# Patient Record
Sex: Male | Born: 2005 | Race: White | Hispanic: No | Marital: Single | Smoking: Never smoker
Health system: Southern US, Community
[De-identification: ages and names within clinical notes are randomized; demographics above are authoritative.]

## PROBLEM LIST (undated history)

## (undated) DIAGNOSIS — J45909 Unspecified asthma, uncomplicated: Secondary | ICD-10-CM

## (undated) DIAGNOSIS — R011 Cardiac murmur, unspecified: Secondary | ICD-10-CM

---
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
## 2023-03-04 ENCOUNTER — Ambulatory Visit
Admission: EM | Admit: 2023-03-04 | Discharge: 2023-03-04 | Disposition: A | Discharge status: Home / Self Care | Payer: BC Managed Care – PPO

## 2023-03-04 ENCOUNTER — Encounter: Payer: Self-pay | Admitting: Emergency Medicine

## 2023-03-04 DIAGNOSIS — J069 Acute upper respiratory infection, unspecified: Secondary | ICD-10-CM

## 2023-03-04 LAB — POCT RAPID STREP A (OFFICE): Rapid Strep A Screen: NEGATIVE

## 2023-03-04 MED ORDER — CYCLOBENZAPRINE HCL 10 MG PO TABS
10.0000 mg | ORAL_TABLET | Freq: Two times a day (BID) | ORAL | 0 refills | Status: DC | PRN
Start: 2023-03-04 — End: 2023-08-26

## 2023-03-04 MED ORDER — PREDNISONE 20 MG PO TABS: 40.0000 mg | ORAL_TABLET | Freq: Every day | ORAL | 0 refills | Status: DC

## 2023-03-04 NOTE — ED Provider Notes (Signed)
 [facility]    CSN: [id number] Arrival date & time: 03/04/23  1102      History   Chief Complaint Chief Complaint  Patient presents with   Sore Throat   Chills    HPI Ernest Lopez is a 18 y.o. male.   Patient presents for evaluation of subjective fever, chills, intermittent generalized headaches, bilateral ear popping and pain, productive cough with green sputum and sore throat present for 3 days, experiencing watery diarrhea today.  Tolerating food and liquid.  Known sick contact prior.  Has attempted use of ibuprofen  and Advil  cold and flu.  History of asthma, denies wheezing and shortness of breath.  Past Medical History:  Diagnosis Date   Asthma    Heart murmur     Patient Active Problem List   Diagnosis Date Noted   Acute appendicitis 05/25/2022   Distal radius fracture 09/27/2017    Past Surgical History:  Procedure Laterality Date   CLOSED REDUCTION WRIST FRACTURE Right 09/28/2017   Procedure: CLOSED REDUCTION WRIST;  Surgeon: Tobie Priest, MD;  Location: [facility] ORS;  Service: Orthopedics;  Laterality: Right;   XI ROBOTIC LAPAROSCOPIC ASSISTED APPENDECTOMY N/A 05/25/2022   Procedure: XI ROBOTIC LAPAROSCOPIC ASSISTED APPENDECTOMY;  Surgeon: Tye Millet, DO;  Location: [facility] ORS;  Service: General;  Laterality: N/A;       Home Medications    Prior to Admission medications   Medication Sig Start Date End Date Taking? Authorizing Provider  albuterol  (PROVENTIL ) (2.5 MG/3ML) 0.083% nebulizer solution  02/14/22  Yes [provider]  cyclobenzaprine  (FLEXERIL ) 10 MG tablet Take 1 tablet (10 mg total) by mouth 2 (two) times daily as needed for muscle spasms. 03/04/23  Yes Suhana Wilner R, NP  predniSONE  (DELTASONE ) 20 MG tablet Take 2 tablets (40 mg total) by mouth daily. 03/04/23  Yes Tanga Gloor R, NP  ibuprofen  (ADVIL ) 800 MG tablet Take 1 tablet (800 mg total) by mouth every 8 (eight) hours as needed for mild pain or moderate pain. 05/26/22    Sakai, Isami, DO    Family History History reviewed. No pertinent family history.  Social History Social History   Tobacco Use   Smoking status: Never   Smokeless tobacco: Never  Vaping Use   Vaping status: Never Used  Substance Use Topics   Alcohol use: No   Drug use: No     Allergies   Patient has no known allergies.   Review of Systems Review of Systems   Physical Exam Triage Vital Signs ED Triage Vitals  Encounter Vitals Group     BP 03/04/23 1214 119/60     Systolic BP Percentile --      Diastolic BP Percentile --      Pulse Rate 03/04/23 1214 69     Resp 03/04/23 1214 18     Temp 03/04/23 1214 98.5 F (36.9 C)     Temp Source 03/04/23 1214 Oral     SpO2 03/04/23 1214 97 %     Weight 03/04/23 1211 145 lb (65.8 kg)     Height 03/04/23 1211 6' 1 (1.854 m)     Head Circumference --      Peak Flow --      Pain Score 03/04/23 1210 6     Pain Loc --      Pain Education --      Exclude from Growth Chart --    No data found.  Updated Vital Signs BP 119/60 (BP Location: Left Arm)  Pulse 69   Temp 98.5 F (36.9 C) (Oral)   Resp 18   Ht 6' 1 (1.854 m)   Wt 145 lb (65.8 kg)   SpO2 97%   BMI 19.13 kg/m   Visual Acuity Right Eye Distance:   Left Eye Distance:   Bilateral Distance:    Right Eye Near:   Left Eye Near:    Bilateral Near:     Physical Exam Constitutional:      Appearance: Normal appearance.  HENT:     Head: Normocephalic.     Right Ear: Tympanic membrane, ear canal and external ear normal.     Left Ear: Tympanic membrane, ear canal and external ear normal.     Nose: Congestion present. No rhinorrhea.     Mouth/Throat:     Mouth: Mucous membranes are moist.     Pharynx: Posterior oropharyngeal erythema present. No oropharyngeal exudate.  Eyes:     Extraocular Movements: Extraocular movements intact.  Neck:     Comments: Tenderness present to bilateral aspects of the neck without ecchymosis swelling or deformity, able to  complete full range of motion, 2+ carotid pulses Cardiovascular:     Rate and Rhythm: Normal rate and regular rhythm.     Pulses: Normal pulses.     Heart sounds: Normal heart sounds.  Pulmonary:     Effort: Pulmonary effort is normal.     Breath sounds: Normal breath sounds.  Musculoskeletal:     Cervical back: Normal range of motion and neck supple.  Neurological:     Mental Status: He is alert and oriented to person, place, and time. Mental status is at baseline.      UC Treatments / Results  Labs (all labs ordered are listed, but only abnormal results are displayed) Labs Reviewed  POCT RAPID STREP A (OFFICE)    EKG   Radiology No results found.  Procedures Procedures (including critical care time)  Medications Ordered in UC Medications - No data to display  Initial Impression / Assessment and Plan / UC Course  I have reviewed the triage vital signs and the nursing notes.  Pertinent labs & imaging results that were available during my care of the patient were reviewed by me and considered in my medical decision making (see chart for details).  Viral URI with cough  Patient is in no signs of distress nor toxic appearing.  Vital signs are stable.  Low suspicion for pneumonia, pneumothorax or bronchitis and therefore will defer imaging.  Strep test negative.  Prescribed prednisone  and Flexeril  for management of neck pain which is most likely a manifestation of bodyaches.  Amended additional supportive measures for muscular pain.May use additional over-the-counter medications as needed for supportive care.  May follow-up with urgent care as needed if symptoms persist or worsen.   Final Clinical Impressions(s) / UC Diagnoses   Final diagnoses:  Viral URI with cough     Discharge Instructions      Your symptoms today are most likely being caused by a virus and should steadily improve in time it can take up to 7 to 10 days before you truly start to see a turnaround  however things will get better  strep test is negative for bacteria  For your neck pain which is most likely a manifestation of bodyaches you may begin prednisone  every morning with food for 5 days, stop use of ibuprofen , may take Tylenol  additionally  May use muscle relaxant twice daily for additional comfort, be mindful this  will make you feel drowsy  May use ice or heat over the affected area, massage and stretch as tolerated    You can take Tylenol  and/or Ibuprofen  as needed for fever reduction and pain relief.   For cough: honey 1/2 to 1 teaspoon (you can dilute the honey in water or another fluid).  You can also use guaifenesin and dextromethorphan for cough. You can use a humidifier for chest congestion and cough.  If you don't have a humidifier, you can sit in the bathroom with the hot shower running.      For sore throat: try warm salt water gargles, cepacol lozenges, throat spray, warm tea or water with lemon/honey, popsicles or ice, or OTC cold relief medicine for throat discomfort.   For congestion: take a daily anti-histamine like Zyrtec , Claritin, and a oral decongestant, such as pseudoephedrine.  You can also use Flonase 1-2 sprays in each nostril daily.   It is important to stay hydrated: drink plenty of fluids (water, gatorade/powerade/pedialyte, juices, or teas) to keep your throat moisturized and help further relieve irritation/discomfort.    ED Prescriptions     Medication Sig Dispense Auth. Provider   predniSONE  (DELTASONE ) 20 MG tablet Take 2 tablets (40 mg total) by mouth daily. 10 tablet Lanny Lipkin R, NP   cyclobenzaprine  (FLEXERIL ) 10 MG tablet Take 1 tablet (10 mg total) by mouth 2 (two) times daily as needed for muscle spasms. 20 tablet Brigham Cobbins R, NP      PDMP not reviewed this encounter.   Teresa Shelba SAUNDERS, NP 03/04/23 1243

## 2023-03-04 NOTE — Discharge Instructions (Signed)
 Your symptoms today are most likely being caused by a virus and should steadily improve in time it can take up to 7 to 10 days before you truly start to see a turnaround however things will get better  strep test is negative for bacteria  For your neck pain which is most likely a manifestation of bodyaches you may begin prednisone  every morning with food for 5 days, stop use of ibuprofen , may take Tylenol  additionally  May use muscle relaxant twice daily for additional comfort, be mindful this will make you feel drowsy  May use ice or heat over the affected area, massage and stretch as tolerated    You can take Tylenol  and/or Ibuprofen  as needed for fever reduction and pain relief.   For cough: honey 1/2 to 1 teaspoon (you can dilute the honey in water or another fluid).  You can also use guaifenesin and dextromethorphan for cough. You can use a humidifier for chest congestion and cough.  If you don't have a humidifier, you can sit in the bathroom with the hot shower running.      For sore throat: try warm salt water gargles, cepacol lozenges, throat spray, warm tea or water with lemon/honey, popsicles or ice, or OTC cold relief medicine for throat discomfort.   For congestion: take a daily anti-histamine like Zyrtec , Claritin, and a oral decongestant, such as pseudoephedrine.  You can also use Flonase 1-2 sprays in each nostril daily.   It is important to stay hydrated: drink plenty of fluids (water, gatorade/powerade/pedialyte, juices, or teas) to keep your throat moisturized and help further relieve irritation/discomfort.

## 2023-03-04 NOTE — ED Triage Notes (Signed)
 Patient in office today complaint of sorethroat, neck, head pain with chills 3ds  with diarrhea x today   OTC: ibuprofen  Denies: N/V unsure of fever

## 2023-03-26 ENCOUNTER — Emergency Department
Admission: EM | Admit: 2023-03-26 | Discharge: 2023-03-26 | Discharge status: Against Medical Advice | Payer: BC Managed Care – PPO | Attending: Emergency Medicine | Admitting: Emergency Medicine

## 2023-03-26 ENCOUNTER — Other Ambulatory Visit: Payer: Self-pay

## 2023-03-26 DIAGNOSIS — Z5321 Procedure and treatment not carried out due to patient leaving prior to being seen by health care provider: Secondary | ICD-10-CM | POA: Diagnosis not present

## 2023-03-26 DIAGNOSIS — R197 Diarrhea, unspecified: Secondary | ICD-10-CM | POA: Diagnosis not present

## 2023-03-26 DIAGNOSIS — R1110 Vomiting, unspecified: Secondary | ICD-10-CM | POA: Insufficient documentation

## 2023-03-26 LAB — URINALYSIS, ROUTINE W REFLEX MICROSCOPIC
Bilirubin Urine: NEGATIVE
Glucose, UA: NEGATIVE mg/dL
Hgb urine dipstick: NEGATIVE
Ketones, ur: NEGATIVE mg/dL
Leukocytes,Ua: NEGATIVE
Nitrite: NEGATIVE
Protein, ur: NEGATIVE mg/dL
Specific Gravity, Urine: 1.025 (ref 1.005–1.030)
pH: 5 (ref 5.0–8.0)

## 2023-03-26 LAB — CBC
HCT: 49.9 % (ref 39.0–52.0)
Hemoglobin: 16.7 g/dL (ref 13.0–17.0)
MCH: 30.2 pg (ref 26.0–34.0)
MCHC: 33.5 g/dL (ref 30.0–36.0)
MCV: 90.2 fL (ref 80.0–100.0)
Platelets: 253 10*3/uL (ref 150–400)
RBC: 5.53 MIL/uL (ref 4.22–5.81)
RDW: 11.9 % (ref 11.5–15.5)
WBC: 17.1 10*3/uL — ABNORMAL HIGH (ref 4.0–10.5)
nRBC: 0 % (ref 0.0–0.2)

## 2023-03-26 LAB — COMPREHENSIVE METABOLIC PANEL
ALT: 19 U/L (ref 0–44)
AST: 25 U/L (ref 15–41)
Albumin: 4.5 g/dL (ref 3.5–5.0)
Alkaline Phosphatase: 82 U/L (ref 38–126)
Anion gap: 11 (ref 5–15)
BUN: 15 mg/dL (ref 6–20)
CO2: 23 mmol/L (ref 22–32)
Calcium: 9.9 mg/dL (ref 8.9–10.3)
Chloride: 104 mmol/L (ref 98–111)
Creatinine, Ser: 1.07 mg/dL (ref 0.61–1.24)
GFR, Estimated: >60 mL/min (ref >60)
Glucose, Bld: 134 mg/dL — ABNORMAL HIGH (ref 70–99)
Potassium: 4.5 mmol/L (ref 3.5–5.1)
Sodium: 138 mmol/L (ref 135–145)
Total Bilirubin: 1.2 mg/dL (ref 0.0–1.2)
Total Protein: 8.3 g/dL — ABNORMAL HIGH (ref 6.5–8.1)

## 2023-03-26 LAB — LIPASE, BLOOD: Lipase: 28 U/L (ref 11–51)

## 2023-03-26 LAB — TROPONIN I (HIGH SENSITIVITY): Troponin I (High Sensitivity): 3 ng/L (ref <18)

## 2023-03-26 MED ORDER — ONDANSETRON 4 MG PO TBDP: 4.0000 mg | ORAL_TABLET | Freq: Once | ORAL | Status: DC | PRN

## 2023-03-26 MED ORDER — ONDANSETRON HCL 4 MG/2ML IJ SOLN
INTRAMUSCULAR | Status: AC
  Filled 2023-03-26: qty 2

## 2023-03-26 MED ORDER — ONDANSETRON HCL 4 MG/2ML IJ SOLN
4.0000 mg | Freq: Once | INTRAMUSCULAR | Status: AC | PRN
  Administered 2023-03-26: 4 mg via INTRAVENOUS

## 2023-03-26 NOTE — ED Notes (Signed)
Pt up to nurses desk, informed leaving, states too many sick people in ER. NAD noted

## 2023-03-26 NOTE — ED Triage Notes (Signed)
Patient reports diarrhea and vomiting that started today. Patient states that his while family is sick but he symptoms are the worse. Patient states he ate at a family members house yesterday. Also reports chest pain 4/10.

## 2023-05-02 ENCOUNTER — Ambulatory Visit: Payer: BC Managed Care – PPO | Admitting: Dermatology

## 2023-08-26 ENCOUNTER — Ambulatory Visit
Admission: EM | Admit: 2023-08-26 | Discharge: 2023-08-26 | Disposition: A | Discharge status: Home / Self Care | Attending: Physician Assistant | Admitting: Physician Assistant

## 2023-08-26 DIAGNOSIS — L282 Other prurigo: Secondary | ICD-10-CM | POA: Diagnosis not present

## 2023-08-26 DIAGNOSIS — L237 Allergic contact dermatitis due to plants, except food: Secondary | ICD-10-CM

## 2023-08-26 MED ORDER — METHYLPREDNISOLONE ACETATE 80 MG/ML IJ SUSP: 60.0000 mg | Freq: Once | INTRAMUSCULAR | Status: DC

## 2023-08-26 MED ORDER — DEXAMETHASONE SODIUM PHOSPHATE 10 MG/ML IJ SOLN
10.0000 mg | Freq: Once | INTRAMUSCULAR | Status: AC
  Administered 2023-08-26: 10 mg via INTRAMUSCULAR

## 2023-08-26 MED ORDER — PREDNISONE 10 MG (21) PO TBPK: ORAL_TABLET | ORAL | 0 refills | Status: AC

## 2023-08-26 NOTE — ED Provider Notes (Signed)
 CAY RALPH PELT    CSN: [id number] Arrival date & time: 08/26/23  1345      History   Chief Complaint Chief Complaint  Patient presents with   Rash    HPI Ernest Lopez is a 18 y.o. male.   Patient presents with a 2-week history of pruritic rash involving his extremities.  He reports that he often gets poison ivy at work and this was a similar presentation with small vesicles and intense pruritus.  He had prednisone  from a leftover prescription and so has been taking 20 mg a day for the past 3 to 4 days but this has not resolved his symptoms.  He is anxious to feel better as he is scheduled to go out of town.  He denies additional antibiotics or steroids in the past 90 days.  Denies history of diabetes.  He has been using over-the-counter poison ivy scrubs and topical medications without improvement of symptoms.  Denies any swelling of his throat, shortness of breath, fever, nausea, vomiting.    Past Medical History:  Diagnosis Date   Asthma    Heart murmur     Patient Active Problem List   Diagnosis Date Noted   Acute appendicitis 05/25/2022   Distal radius fracture 09/27/2017    Past Surgical History:  Procedure Laterality Date   CLOSED REDUCTION WRIST FRACTURE Right 09/28/2017   Procedure: CLOSED REDUCTION WRIST;  Surgeon: Tobie Priest, MD;  Location: [facility] ORS;  Service: Orthopedics;  Laterality: Right;   XI ROBOTIC LAPAROSCOPIC ASSISTED APPENDECTOMY N/A 05/25/2022   Procedure: XI ROBOTIC LAPAROSCOPIC ASSISTED APPENDECTOMY;  Surgeon: Tye Millet, DO;  Location: [facility] OR[facility];  Service: General;  Laterality: N/A;       Home Medications    Prior to Admission medications   Medication Sig Start Date End Date Taking? Authorizing Provider  predniSONE  (STERAPRED UNI-PAK 21 TAB) 10 MG (21) TBPK tablet As directed 08/26/23  Yes Amaka Gluth K, PA-C  albuterol  (PROVENTIL ) (2.5 MG/3ML) 0.083% nebulizer solution  02/14/22   [provider]    Family  History History reviewed. No pertinent family history.  Social History Social History   Tobacco Use   Smoking status: Never   Smokeless tobacco: Never  Vaping Use   Vaping status: Every Day  Substance Use Topics   Alcohol use: No   Drug use: No     Allergies   Patient has no known allergies.   Review of Systems Review of Systems  Constitutional:  Positive for activity change. Negative for appetite change, fatigue and fever.  HENT:  Negative for sore throat, trouble swallowing and voice change.   Respiratory:  Negative for shortness of breath.   Gastrointestinal:  Negative for diarrhea, nausea and vomiting.  Skin:  Positive for rash. Negative for wound.     Physical Exam Triage Vital Signs ED Triage Vitals  Encounter Vitals Group     BP 08/26/23 1356 (!) 122/56     Girls Systolic BP Percentile --      Girls Diastolic BP Percentile --      Boys Systolic BP Percentile --      Boys Diastolic BP Percentile --      Pulse Rate 08/26/23 1356 65     Resp 08/26/23 1356 18     Temp 08/26/23 1356 98.1 F (36.7 C)     Temp Source 08/26/23 1356 Oral     SpO2 08/26/23 1356 97 %     Weight --      Height --  Head Circumference --      Peak Flow --      Pain Score 08/26/23 1357 0     Pain Loc --      Pain Education --      Exclude from Growth Chart --    No data found.  Updated Vital Signs BP (!) 122/56 (BP Location: Right Arm)   Pulse 65   Temp 98.1 F (36.7 C) (Oral)   Resp 18   SpO2 97%   Visual Acuity Right Eye Distance:   Left Eye Distance:   Bilateral Distance:    Right Eye Near:   Left Eye Near:    Bilateral Near:     Physical Exam Vitals reviewed.  Constitutional:      General: He is awake.     Appearance: Normal appearance. He is well-developed. He is not ill-appearing.     Comments: Very pleasant male appears stated age in no acute distress sitting comfortably in exam room  HENT:     Head: Normocephalic and atraumatic.     Mouth/Throat:      Pharynx: Uvula midline. No oropharyngeal exudate or posterior oropharyngeal erythema.   Cardiovascular:     Rate and Rhythm: Normal rate and regular rhythm.     Heart sounds: Normal heart sounds, S1 normal and S2 normal. No murmur heard. Pulmonary:     Effort: Pulmonary effort is normal.     Breath sounds: Normal breath sounds. No stridor. No wheezing, rhonchi or rales.     Comments: Clear to auscultation bilaterally  Skin:    Findings: Rash present. Rash is macular and papular.     Comments: Scattered macular papular rash on posterior legs and upper arms with a few small vesicles noted upper lateral left arm.  No active bleeding or drainage noted.  No wounds on exam.   Neurological:     Mental Status: He is alert.   Psychiatric:        Behavior: Behavior is cooperative.      UC Treatments / Results  Labs (all labs ordered are listed, but only abnormal results are displayed) Labs Reviewed - No data to display  EKG   Radiology No results found.  Procedures Procedures (including critical care time)  Medications Ordered in UC Medications  dexamethasone  (DECADRON ) injection 10 mg (10 mg Intramuscular Given 08/26/23 1441)    Initial Impression / Assessment and Plan / UC Course  I have reviewed the triage vital signs and the nursing notes.  Pertinent labs & imaging results that were available during my care of the patient were reviewed by me and considered in my medical decision making (see chart for details).     Patient is well-appearing, afebrile, nontoxic, nontachycardic.  Concern for rhus dermatitis given clinical presentation.  He was given 10 mg Decadron  in clinic to help manage symptoms.  He was given prednisone  taper to begin tomorrow (08/27/2023) with instructions to take NSAIDs with this medication to risk of GI bleeding.  He is to keep the area clean and avoid scratching to prevent secondary infection.  We discussed that if he has any worsening or changing  symptoms he needs to be seen immediately.  Strict return precautions given.  Final Clinical Impressions(s) / UC Diagnoses   Final diagnoses:  Allergic contact dermatitis due to plants, except food  Pruritic rash     Discharge Instructions      We gave an injection of steroids today.  Placed her prednisone  tomorrow (08/27/2023).  Take as  prescribed and do not take any NSAIDs with this medication including aspirin, ibuprofen /Advil , naproxen/Aleve.  Keep ears clean and avoid scratching as this can lead to secondary infection.  If your rash is not improving or if anything worsens you need to be seen immediately.     ED Prescriptions     Medication Sig Dispense Auth. Provider   predniSONE  (STERAPRED UNI-PAK 21 TAB) 10 MG (21) TBPK tablet As directed 21 tablet Athira Janowicz K, PA-C      PDMP not reviewed this encounter.   Sherrell Rocky POUR, PA-C 08/26/23 1504

## 2023-08-26 NOTE — ED Triage Notes (Signed)
 Pt being seen in UC for rash x2 weeks. Pt reports having poison oak and being on prednisone  tablets with lingering rash throughout body.

## 2023-08-26 NOTE — Discharge Instructions (Signed)
 We gave an injection of steroids today.  Placed her prednisone  tomorrow (08/27/2023).  Take as prescribed and do not take any NSAIDs with this medication including aspirin, ibuprofen /Advil , naproxen/Aleve.  Keep ears clean and avoid scratching as this can lead to secondary infection.  If your rash is not improving or if anything worsens you need to be seen immediately.

## 2023-09-06 ENCOUNTER — Ambulatory Visit: Payer: BC Managed Care – PPO | Admitting: Dermatology

## 2023-10-02 ENCOUNTER — Ambulatory Visit: Admitting: Dermatology

## 2023-10-10 ENCOUNTER — Ambulatory Visit: Admitting: Dermatology

## 2023-10-10 ENCOUNTER — Encounter: Payer: Self-pay | Admitting: Dermatology

## 2023-10-10 DIAGNOSIS — Q825 Congenital non-neoplastic nevus: Secondary | ICD-10-CM | POA: Diagnosis not present

## 2023-10-10 DIAGNOSIS — L237 Allergic contact dermatitis due to plants, except food: Secondary | ICD-10-CM | POA: Diagnosis not present

## 2023-10-10 DIAGNOSIS — L578 Other skin changes due to chronic exposure to nonionizing radiation: Secondary | ICD-10-CM

## 2023-10-10 DIAGNOSIS — D229 Melanocytic nevi, unspecified: Secondary | ICD-10-CM

## 2023-10-10 DIAGNOSIS — Z1283 Encounter for screening for malignant neoplasm of skin: Secondary | ICD-10-CM

## 2023-10-10 DIAGNOSIS — L814 Other melanin hyperpigmentation: Secondary | ICD-10-CM

## 2023-10-10 MED ORDER — CLOBETASOL PROPIONATE 0.05 % EX CREA: TOPICAL_CREAM | CUTANEOUS | 2 refills | Status: AC

## 2023-10-10 NOTE — Progress Notes (Signed)
   Follow-Up Visit   Subjective  Ernest Lopez is a 18 y.o. male who presents for the following: Skin Cancer Screening and Full Body Skin Exam. No personal Hx of skin cancer or dysplastic nevi.  Family Hx of skin cancer, unknown type.  The patient presents for Total-Body Skin Exam (TBSE) for skin cancer screening and mole check. The patient has spots, moles and lesions to be evaluated, some may be new or changing and the patient may have concern these could be cancer.  The following portions of the chart were reviewed this encounter and updated as appropriate: medications, allergies, medical history  Review of Systems:  No other skin or systemic complaints except as noted in HPI or Assessment and Plan.  Objective  Well appearing patient in no apparent distress; mood and affect are within normal limits.  A full examination was performed including scalp, head, eyes, ears, nose, lips, neck, chest, axillae, abdomen, back, buttocks, bilateral upper extremities, bilateral lower extremities, hands, feet, fingers, toes, fingernails, and toenails. All findings within normal limits unless otherwise noted below.   Relevant physical exam findings are noted in the Assessment and Plan.  Mid Back Hypopigmented irregular patch  Assessment & Plan   SKIN CANCER SCREENING PERFORMED TODAY.  LENTIGINES,  - Benign normal skin lesions - Benign-appearing - Call for any changes  MELANOCYTIC NEVI - Tan-brown and/or pink-flesh-colored symmetric macules and papules - 0.2 cm regular brown macule. Stable compared to photo. - Benign appearing on exam today - Observation - Call clinic for new or changing moles - Recommend daily use of broad spectrum spf 30+ sunscreen to sun-exposed areas.   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  Observation.  Call clinic for new or changing lesions.  Recommend daily use of broad spectrum spf 30+ sunscreen to sun-exposed areas.    ALLERGIC CONTACT DERMATITIS Poison ivy Exam: scaly pink papules and/or plaques +/- vesiculation at arms and legs Treatment Plan: Start Clobetasol  0.05% cream apply twice a day to affected areas with rash/itching. Avoid applying to face, groin, and axilla. Use as directed. Long-term use can cause thinning of the skin.  Topical steroids (such as triamcinolone, fluocinolone, fluocinonide, mometasone, clobetasol , halobetasol, betamethasone, hydrocortisone) can cause thinning and lightening of the skin if they are used for too long in the same area. Your physician has selected the right strength medicine for your problem and area affected on the body. Please use your medication only as directed by your physician to prevent side effects.    Family History of Dysplastic Nevi   NEVUS ANEMICUS Mid Back Benign-appearing.  Observation.  Call clinic for new or changing lesions.  Recommend daily use of broad spectrum spf 30+ sunscreen to sun-exposed areas.   ACTINIC SKIN DAMAGE   LENTIGO   SKIN CANCER SCREENING   ALLERGIC CONTACT DERMATITIS DUE TO PLANTS, EXCEPT FOOD   MELANOCYTIC NEVUS, UNSPECIFIED LOCATION   Return for TBSE in 1-2 years.  I, Jill Parcell, CMA, am acting as scribe for Alm Rhyme, MD.   Documentation: I have reviewed the above documentation for accuracy and completeness, and I agree with the above.  Alm Rhyme, MD

## 2023-10-10 NOTE — Patient Instructions (Addendum)
 Start Clobetasol  0.05% cream apply twice a day to affected areas with rash/itching. Avoid applying to face, groin, and axilla. Use as directed. Long-term use can cause thinning of the skin.  Topical steroids (such as triamcinolone, fluocinolone, fluocinonide, mometasone, clobetasol , halobetasol, betamethasone, hydrocortisone) can cause thinning and lightening of the skin if they are used for too long in the same area. Your physician has selected the right strength medicine for your problem and area affected on the body. Please use your medication only as directed by your physician to prevent side effects.     Recommend daily broad spectrum sunscreen SPF 30+ to sun-exposed areas, reapply every 2 hours as needed. Call for new or changing lesions.  Staying in the shade or wearing long sleeves, sun glasses (UVA+UVB protection) and wide brim hats (4-inch brim around the entire circumference of the hat) are also recommended for sun protection.     Melanoma ABCDEs  Melanoma is the most dangerous type of skin cancer, and is the leading cause of death from skin disease.  You are more likely to develop melanoma if you: Have light-colored skin, light-colored eyes, or red or blond hair Spend a lot of time in the sun Tan regularly, either outdoors or in a tanning bed Have had blistering sunburns, especially during childhood Have a close family member who has had a melanoma Have atypical moles or large birthmarks  Early detection of melanoma is key since treatment is typically straightforward and cure rates are extremely high if we catch it early.   The first sign of melanoma is often a change in a mole or a new dark spot.  The ABCDE system is a way of remembering the signs of melanoma.  A for asymmetry:  The two halves do not match. B for border:  The edges of the growth are irregular. C for color:  A mixture of colors are present instead of an even brown color. D for diameter:  Melanomas are usually  (but not always) greater than 6mm - the size of a pencil eraser. E for evolution:  The spot keeps changing in size, shape, and color.  Please check your skin once per month between visits. You can use a small mirror in front and a large mirror behind you to keep an eye on the back side or your body.   If you see any new or changing lesions before your next follow-up, please call to schedule a visit.  Please continue daily skin protection including broad spectrum sunscreen SPF 30+ to sun-exposed areas, reapplying every 2 hours as needed when you're outdoors.   Staying in the shade or wearing long sleeves, sun glasses (UVA+UVB protection) and wide brim hats (4-inch brim around the entire circumference of the hat) are also recommended for sun protection.     Due to recent changes in healthcare laws, you may see results of your pathology and/or laboratory studies on MyChart before the doctors have had a chance to review them. We understand that in some cases there may be results that are confusing or concerning to you. Please understand that not all results are received at the same time and often the doctors may need to interpret multiple results in order to provide you with the best plan of care or course of treatment. Therefore, we ask that you please give us  2 business days to thoroughly review all your results before contacting the office for clarification. Should we see a critical lab result, you will be contacted sooner.  If You Need Anything After Your Visit  If you have any questions or concerns for your doctor, please call our main line at [phone] and press option 4 to reach your doctor's medical assistant. If no one answers, please leave a voicemail as directed and we will return your call as soon as possible. Messages left after 4 pm will be answered the following business day.   You may also send us  a message via MyChart. We typically respond to MyChart messages within 1-2 business  days.  For prescription refills, please ask your pharmacy to contact our office. Our fax number is [phone].  If you have an urgent issue when the clinic is closed that cannot wait until the next business day, you can page your doctor at the number below.    Please note that while we do our best to be available for urgent issues outside of office hours, we are not available 24/7.   If you have an urgent issue and are unable to reach us , you may choose to seek medical care at your doctor's office, retail clinic, urgent care center, or emergency room.  If you have a medical emergency, please immediately call 911 or go to the emergency department.  Pager Numbers  - Dr. Hester: [phone]  - Dr. Jackquline: [phone]  - Dr. Claudene: [phone]   In the event of inclement weather, please call our main line at [phone] for an update on the status of any delays or closures.  Dermatology Medication Tips: Please keep the boxes that topical medications come in in order to help keep track of the instructions about where and how to use these. Pharmacies typically print the medication instructions only on the boxes and not directly on the medication tubes.   If your medication is too expensive, please contact our office at [phone] option 4 or send us  a message through MyChart.   We are unable to tell what your co-pay for medications will be in advance as this is different depending on your insurance coverage. However, we may be able to find a substitute medication at lower cost or fill out paperwork to get insurance to cover a needed medication.   If a prior authorization is required to get your medication covered by your insurance company, please allow us  1-2 business days to complete this process.  Drug prices often vary depending on where the prescription is filled and some pharmacies may offer cheaper prices.  The website www.goodrx.com contains coupons for medications  through different pharmacies. The prices here do not account for what the cost may be with help from insurance (it may be cheaper with your insurance), but the website can give you the price if you did not use any insurance.  - You can print the associated coupon and take it with your prescription to the pharmacy.  - You may also stop by our office during regular business hours and pick up a GoodRx coupon card.  - If you need your prescription sent electronically to a different pharmacy, notify our office through [facility] or by phone at [phone] option 4.     Si Usted Necesita Algo Despus de Su Visita  Tambin puede enviarnos un mensaje a travs de Clinical cytogeneticist. Por lo general respondemos a los mensajes de MyChart en el transcurso de 1 a 2 das hbiles.  Para renovar recetas, por favor pida a su farmacia que se ponga en contacto con nuestra oficina. Randi lakes de fax es [place] [phone].  Si tiene un asunto urgente cuando la clnica est cerrada y que no puede esperar hasta el siguiente da hbil, puede llamar/localizar a su doctor(a) al nmero que aparece a continuacin.   Por favor, tenga en cuenta que aunque hacemos todo lo posible para estar disponibles para asuntos urgentes fuera del horario de [place], no estamos disponibles las 24 horas del da, los 7 [address]  [address] de la [place].   Si tiene un problema urgente y no puede comunicarse con nosotros, puede optar por buscar atencin mdica  en el consultorio de su doctor(a), en una clnica privada, en un centro de atencin urgente o en una sala de emergencias.  Si tiene Engineer, drilling, por favor llame inmediatamente al 911 o vaya a la sala de emergencias.  Nmeros de bper  - Dr. Hester: [phone]  - Dra. Jackquline: [phone]  - Dr. Claudene: [phone]   En caso de inclemencias del tiempo, por favor llame a landry capes principal al [phone] para una actualizacin sobre el [place] de cualquier retraso o  cierre.  Consejos para la medicacin en dermatologa: Por favor, guarde las cajas en las que vienen los medicamentos de uso tpico para ayudarle a seguir las instrucciones sobre dnde y cmo usarlos. Las farmacias generalmente imprimen las instrucciones del medicamento slo en las cajas y no directamente en los tubos del [place].   Si su medicamento es muy caro, por favor, pngase en contacto con landry rieger llamando al [phone] y presione la opcin 4 o envenos un mensaje a travs de Clinical cytogeneticist.   No podemos decirle cul ser su copago por los medicamentos por adelantado ya que esto es diferente dependiendo de la cobertura de su seguro. Sin embargo, es posible que podamos encontrar un medicamento sustituto a Audiological scientist un formulario para que el seguro cubra el medicamento que se considera necesario.   Si se requiere una autorizacin previa para que su compaa de seguros malta su medicamento, por favor permtanos de 1 a 2 das hbiles para completar este proceso.  Los precios de los medicamentos varan con frecuencia dependiendo del Environmental consultant de dnde se surte la receta y alguna farmacias pueden ofrecer precios ms baratos.  El sitio web www.goodrx.com tiene cupones para medicamentos de Health and safety inspector. Los precios aqu no tienen en cuenta lo que podra costar con la ayuda del seguro (puede ser ms barato con su seguro), pero el sitio web puede darle el precio si no utiliz Tourist information centre manager.  - Puede imprimir el cupn correspondiente y llevarlo con su receta a la farmacia.  - Tambin puede pasar por nuestra oficina durante el horario de atencin regular y Education officer, museum una tarjeta de cupones de GoodRx.  - Si necesita que su receta se enve electrnicamente a una farmacia diferente, informe a nuestra oficina a travs de MyChart de [place] o por telfono llamando al [phone] y presione la opcin 4.

## 2023-10-12 ENCOUNTER — Ambulatory Visit

## 2023-10-15 ENCOUNTER — Ambulatory Visit
Admission: EM | Admit: 2023-10-15 | Discharge: 2023-10-15 | Disposition: A | Discharge status: Home / Self Care | Attending: Emergency Medicine | Admitting: Emergency Medicine

## 2023-10-15 ENCOUNTER — Other Ambulatory Visit: Payer: Self-pay

## 2023-10-15 DIAGNOSIS — L237 Allergic contact dermatitis due to plants, except food: Secondary | ICD-10-CM

## 2023-10-15 MED ORDER — DEXAMETHASONE SODIUM PHOSPHATE 10 MG/ML IJ SOLN
10.0000 mg | Freq: Once | INTRAMUSCULAR | Status: AC
  Administered 2023-10-15: 10 mg via INTRAMUSCULAR

## 2023-10-15 MED ORDER — CETIRIZINE HCL 10 MG PO TABS
10.0000 mg | ORAL_TABLET | Freq: Every day | ORAL | 0 refills | Status: AC
Start: 2023-10-15 — End: 2023-10-29

## 2023-10-15 NOTE — ED Provider Notes (Signed)
 CAY RALPH PELT    CSN: [id number] Arrival date & time: 10/15/23  1734      History   Chief Complaint Chief Complaint  Patient presents with   Rash    HPI Ernest Lopez is a 18 y.o. male.  Patient presents with >2 month history of pruritic rash on his trunk and extremities.  The rash was improving but he was recently exposed to poison ivy.  In the last few days he has been working outside in the heat wearing jeans and sweating.  The rash has gotten worse.  He has been using clobetasol  cream.  Patient was seen at this urgent care on 08/26/2023; diagnosed with allergic contact dermatitis and pruritic rash; treated with dexamethasone  injection and prednisone  taper.  Patient was seen by a dermatologist at [facility] on 10/10/2023 for various skin issues including the current rash which was again diagnosed as allergic contact dermatitis and treated with clobetasol .   The history is provided by the patient and medical records.    Past Medical History:  Diagnosis Date   Asthma    Heart murmur     Patient Active Problem List   Diagnosis Date Noted   Acute appendicitis 05/25/2022   Distal radius fracture 09/27/2017    Past Surgical History:  Procedure Laterality Date   CLOSED REDUCTION WRIST FRACTURE Right 09/28/2017   Procedure: CLOSED REDUCTION WRIST;  Surgeon: Tobie Priest, MD;  Location: [facility];  Service: Orthopedics;  Laterality: Right;   XI ROBOTIC LAPAROSCOPIC ASSISTED APPENDECTOMY N/A 05/25/2022   Procedure: XI ROBOTIC LAPAROSCOPIC ASSISTED APPENDECTOMY;  Surgeon: Tye Millet, DO;  Location: [facility];  Service: General;  Laterality: N/A;       Home Medications    Prior to Admission medications   Medication Sig Start Date End Date Taking? Authorizing Provider  cetirizine  (ZYRTEC  ALLERGY) 10 MG tablet Take 1 tablet (10 mg total) by mouth daily for 14 days. 10/15/23 10/29/23 Yes Corlis Burnard DEL, NP  albuterol  (PROVENTIL ) (2.5 MG/3ML) 0.083% nebulizer  solution  02/14/22   [provider]  clobetasol  cream (TEMOVATE ) 0.05 % Apply twice a day to affected areas with rash/itching. Avoid applying to face, groin, and axilla. 10/10/23   Hester Alm BROCKS, MD  predniSONE  (STERAPRED UNI-PAK 21 TAB) 10 MG (21) TBPK tablet As directed 08/26/23   Raspet, Rocky POUR, PA-C    Family History History reviewed. No pertinent family history.  Social History Social History   Tobacco Use   Smoking status: Never   Smokeless tobacco: Never  Vaping Use   Vaping status: Every Day  Substance Use Topics   Alcohol use: No   Drug use: No     Allergies   Patient has no known allergies.   Review of Systems Review of Systems  Constitutional:  Negative for chills and fever.  HENT:  Negative for sore throat and trouble swallowing.   Respiratory:  Negative for cough and shortness of breath.   Skin:  Positive for color change and rash.     Physical Exam Triage Vital Signs ED Triage Vitals  Encounter Vitals Group     BP 10/15/23 1743 112/65     Girls Systolic BP Percentile --      Girls Diastolic BP Percentile --      Boys Systolic BP Percentile --      Boys Diastolic BP Percentile --      Pulse Rate 10/15/23 1743 76     Resp 10/15/23 1743 16  Temp 10/15/23 1743 97.8 F (36.6 C)     Temp Source 10/15/23 1743 Oral     SpO2 10/15/23 1743 98 %     Weight --      Height --      Head Circumference --      Peak Flow --      Pain Score 10/15/23 1742 0     Pain Loc --      Pain Education --      Exclude from Growth Chart --    No data found.  Updated Vital Signs BP 112/65 (BP Location: Left Arm)   Pulse 76   Temp 97.8 F (36.6 C) (Oral)   Resp 16   SpO2 98%   Visual Acuity Right Eye Distance:   Left Eye Distance:   Bilateral Distance:    Right Eye Near:   Left Eye Near:    Bilateral Near:     Physical Exam Constitutional:      General: He is not in acute distress. HENT:     Mouth/Throat:     Mouth: Mucous membranes are  moist.  Cardiovascular:     Rate and Rhythm: Normal rate and regular rhythm.  Pulmonary:     Effort: Pulmonary effort is normal. No respiratory distress.  Skin:    General: Skin is warm and dry.     Findings: Rash present.     Comments: Scattered patchy erythematous papular rash on extremities.  Many of the lesions have been scratched.  No purulent drainage.  Neurological:     Mental Status: He is alert.      UC Treatments / Results  Labs (all labs ordered are listed, but only abnormal results are displayed) Labs Reviewed - No data to display  EKG   Radiology No results found.  Procedures Procedures (including critical care time)  Medications Ordered in UC Medications  dexamethasone  (DECADRON ) injection 10 mg (has no administration in time range)    Initial Impression / Assessment and Plan / UC Course  I have reviewed the triage vital signs and the nursing notes.  Pertinent labs & imaging results that were available during my care of the patient were reviewed by me and considered in my medical decision making (see chart for details).    Poison ivy dermatitis.  Dexamethasone  injection given here.  Instructed patient to take Zyrtec  nightly at bedtime x 14 days.  Instructed him to follow-up with his dermatologist.  Education provided on poison ivy dermatitis.  He agrees to plan of care.  Final Clinical Impressions(s) / UC Diagnoses   Final diagnoses:  Poison ivy dermatitis     Discharge Instructions      You were given an injection of dexamethasone  here.  Take the Zyrtec  as directed.  Follow-up with your dermatologist.     ED Prescriptions     Medication Sig Dispense Auth. Provider   cetirizine  (ZYRTEC  ALLERGY) 10 MG tablet Take 1 tablet (10 mg total) by mouth daily for 14 days. 14 tablet Corlis Burnard DEL, NP      PDMP not reviewed this encounter.   Corlis Burnard DEL, NP 10/15/23 [id number]

## 2023-10-15 NOTE — ED Triage Notes (Signed)
 Pt is here for a follow up for his all over body rash that started 08/12/2023, pt states he saw a Dermatoglist a few days ago & has been using the cream prescribed but nothing has helped. [place]

## 2023-10-15 NOTE — Discharge Instructions (Addendum)
 You were given an injection of dexamethasone  here.  Take the Zyrtec  as directed.  Follow-up with your dermatologist.

## 2023-10-16 ENCOUNTER — Ambulatory Visit: Payer: Self-pay

## 2024-03-17 IMAGING — DX DG CHEST 2V
2 series · 2 of 2 positions shown · non-contrast
Comparison: None Available.

CLINICAL DATA: Wheezing, cough

EXAM:
CHEST - 2 VIEW

[chest pa]
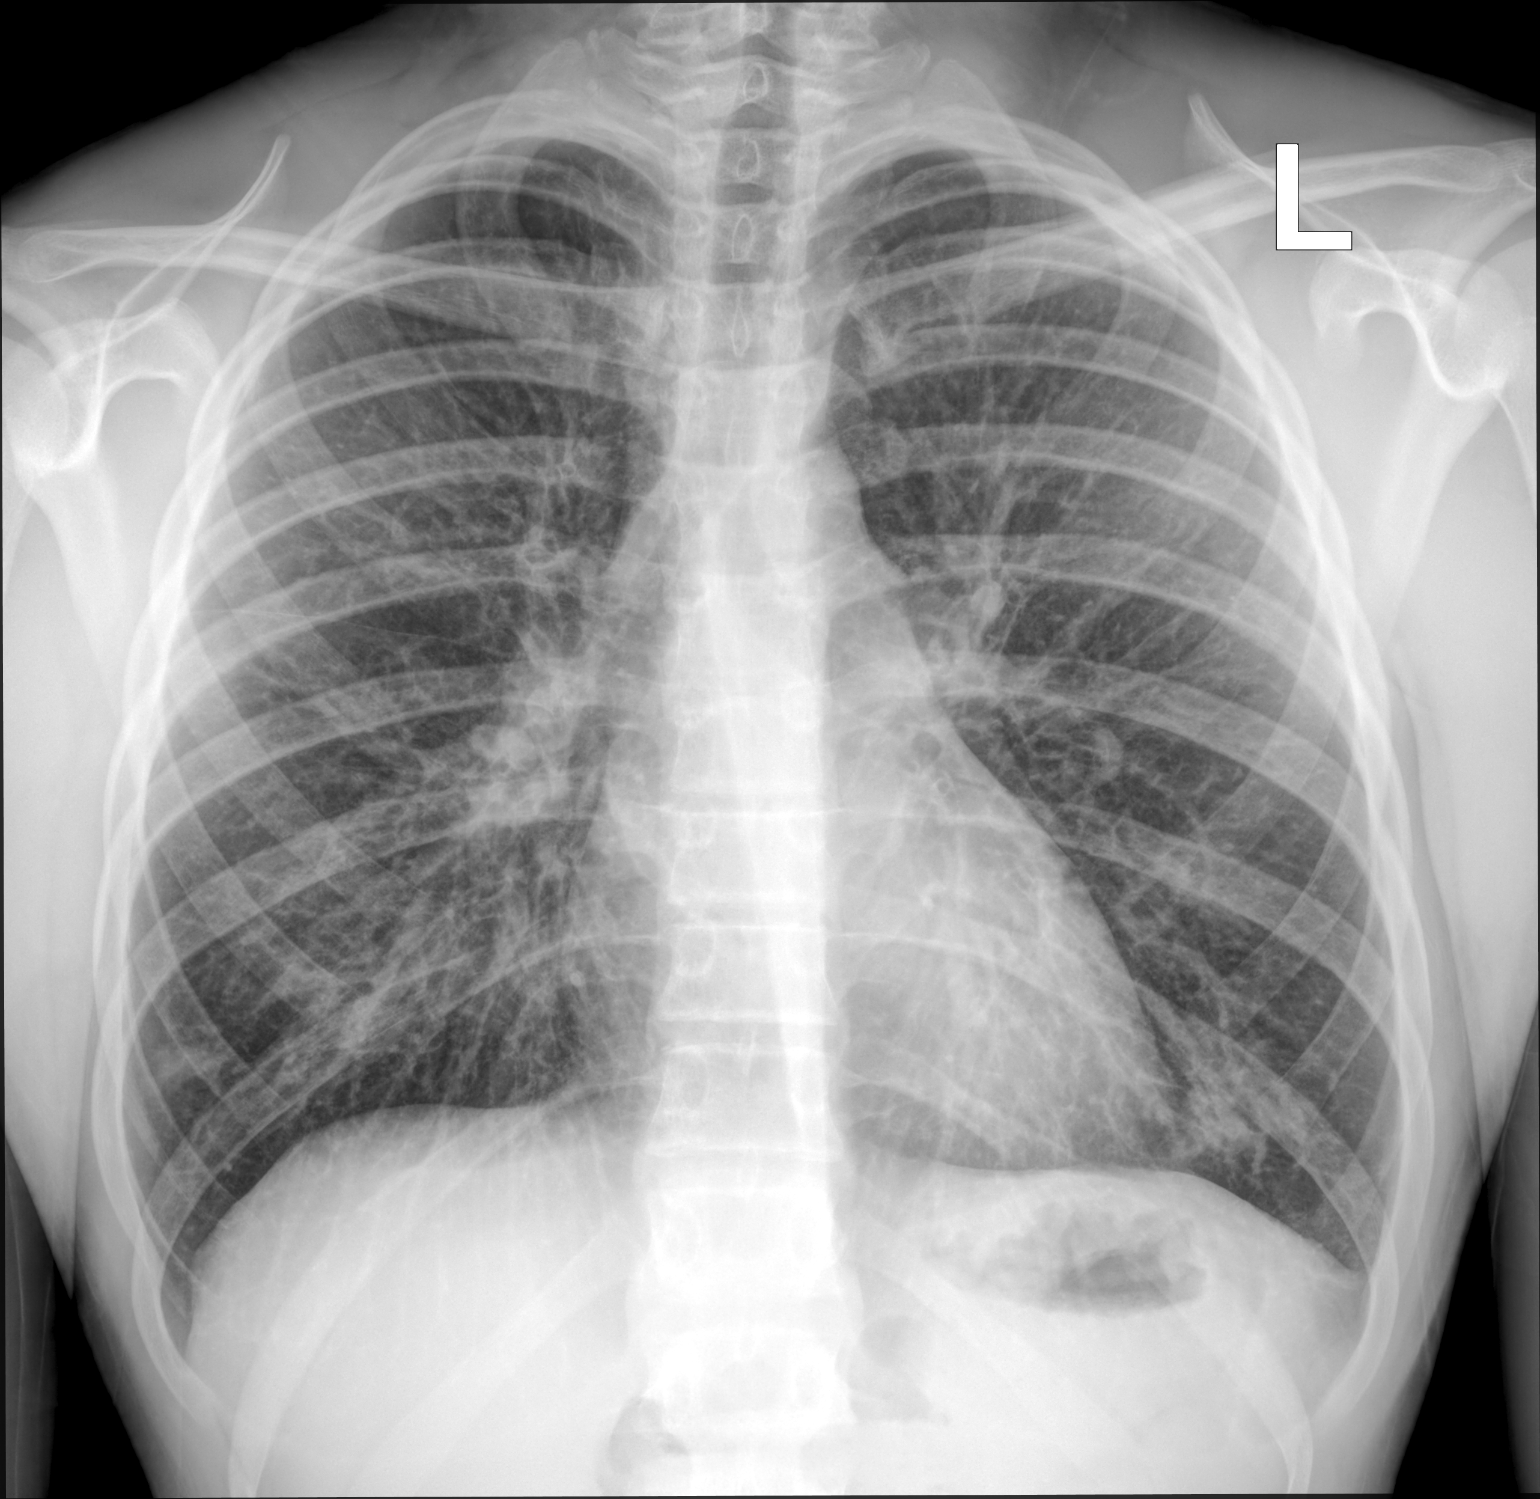

[chest lat]
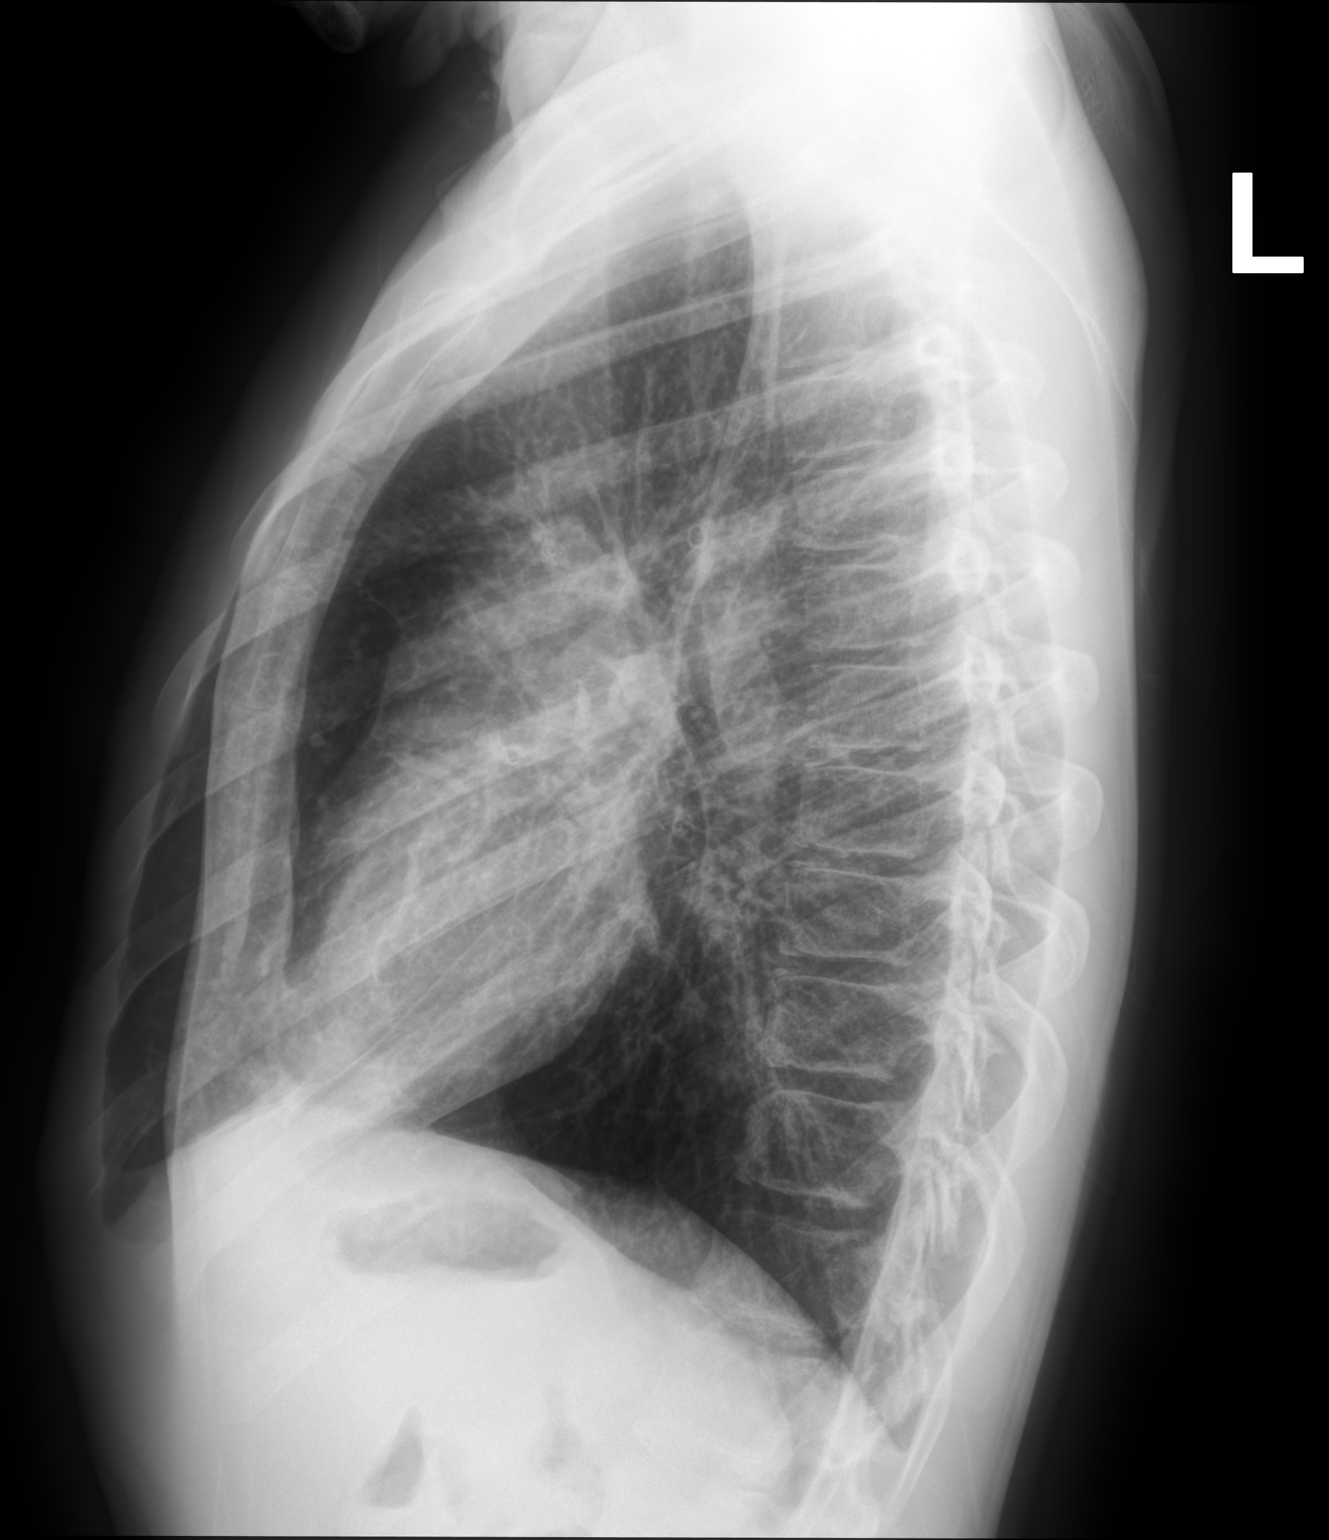

[2 of 2 positions shown; findings below may reference images not displayed]

FINDINGS: Central bronchitic changes with streaky mild bibasilar
bronchovascular opacities suggesting mild bibasilar
bronchopneumonia.

Normal heart size and vascularity. No effusion, edema pattern,
pneumothorax. Trachea midline.
IMPRESSION: Streaky and nodular scattered bibasilar opacities concerning for
basilar bronchopneumonia.

## 2024-10-15 ENCOUNTER — Ambulatory Visit: Admitting: Dermatology
